# Patient Record
Sex: Male | Born: 1960
Health system: Southern US, Community
[De-identification: ages and names within clinical notes are randomized; demographics above are authoritative.]

## PROBLEM LIST (undated history)

## (undated) DIAGNOSIS — J449 Chronic obstructive pulmonary disease, unspecified: Secondary | ICD-10-CM

## (undated) DIAGNOSIS — M1990 Unspecified osteoarthritis, unspecified site: Secondary | ICD-10-CM

## (undated) DIAGNOSIS — M4800 Spinal stenosis, site unspecified: Secondary | ICD-10-CM

## (undated) DIAGNOSIS — G4730 Sleep apnea, unspecified: Secondary | ICD-10-CM

## (undated) DIAGNOSIS — E119 Type 2 diabetes mellitus without complications: Secondary | ICD-10-CM

## (undated) DIAGNOSIS — K759 Inflammatory liver disease, unspecified: Secondary | ICD-10-CM

## (undated) HISTORY — DX: Type 2 diabetes mellitus without complications: E11.9

## (undated) HISTORY — DX: Chronic obstructive pulmonary disease, unspecified: J44.9

## (undated) HISTORY — PX: TONSILLECTOMY: SUR1361

---

## 2014-10-13 ENCOUNTER — Encounter: Payer: Self-pay | Admitting: Neurology

## 2014-10-13 ENCOUNTER — Ambulatory Visit (INDEPENDENT_AMBULATORY_CARE_PROVIDER_SITE_OTHER): Payer: BC Managed Care – PPO | Admitting: Neurology

## 2014-10-13 VITALS — BP 133/80 | HR 84 | Ht 69.0 in | Wt 251.0 lb

## 2014-10-13 DIAGNOSIS — Z9989 Dependence on other enabling machines and devices: Principal | ICD-10-CM

## 2014-10-13 DIAGNOSIS — E785 Hyperlipidemia, unspecified: Secondary | ICD-10-CM

## 2014-10-13 DIAGNOSIS — E669 Obesity, unspecified: Secondary | ICD-10-CM

## 2014-10-13 DIAGNOSIS — G4733 Obstructive sleep apnea (adult) (pediatric): Secondary | ICD-10-CM

## 2014-10-13 MED ORDER — OMEGA-3-ACID ETHYL ESTERS 1 G PO CAPS: 1.0000 g | ORAL_CAPSULE | Freq: Two times a day (BID) | ORAL | Status: DC

## 2014-10-13 NOTE — Patient Instructions (Addendum)
Please continue using your CPAP regularly. While your insurance requires that you use CPAP at least 4 hours each night on 70% of the nights, I recommend, that you not skip any nights and use it throughout the night if you can. Getting used to CPAP and staying with the treatment long term does take time and patience and discipline. Untreated obstructive sleep apnea when it is moderate to severe can have an adverse impact on cardiovascular health and raise her risk for heart disease, arrhythmias, hypertension, congestive heart failure, stroke and diabetes. Untreated obstructive sleep apnea causes sleep disruption, nonrestorative sleep, and sleep deprivation. This can have an impact on your day to day functioning and cause daytime sleepiness and impairment of cognitive function, memory loss, mood disturbance, and problems focussing. Using CPAP regularly can improve these symptoms.   You can try Melatonin at night for sleep: take 3 to 5 mg one to 2 hours before your bedtime.   Please remember to try to maintain good sleep hygiene, which means: Keep a regular sleep and wake schedule, try not to exercise or have a meal within 2 hours of your bedtime, try to keep your bedroom conducive for sleep, that is, cool and dark, without light distractors such as an illuminated alarm clock, and refrain from watching TV right before sleep or in the middle of the night and do not keep the TV or radio on during the night. Also, try not to use or play on electronic devices at bedtime, such as your cell phone, tablet PC or laptop. If you like to read at bedtime on an electronic device, try to dim the background light as much as possible. Do not eat in the middle of the night.
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
## 2014-11-23 ENCOUNTER — Encounter: Payer: Self-pay | Admitting: Neurology

## 2014-12-27 ENCOUNTER — Other Ambulatory Visit: Payer: Self-pay | Admitting: Neurology

## 2014-12-28 NOTE — Telephone Encounter (Signed)
I called and spoke with patient.  They just got a PCP, and are agreeable to contacting them for refills.

## 2014-12-28 NOTE — Telephone Encounter (Signed)
Please see note. Patient was supposed to get prescription from primary care physician and I bridged prescription. He needs to get refill for all cholesterol and blood pressure medications from primary care physician. Please advise patient.

## 2014-12-28 NOTE — Telephone Encounter (Signed)
This was prescribed at [facility] in Nov.  Wasn't sure if you wanted to refill?  Thank you.

## 2015-04-12 ENCOUNTER — Other Ambulatory Visit: Payer: Self-pay | Admitting: Gastroenterology

## 2015-04-12 DIAGNOSIS — B1920 Unspecified viral hepatitis C without hepatic coma: Secondary | ICD-10-CM

## 2015-04-13 ENCOUNTER — Ambulatory Visit: Payer: BC Managed Care – PPO | Admitting: Neurology

## 2015-04-21 ENCOUNTER — Ambulatory Visit
Admission: RE | Admit: 2015-04-21 | Discharge: 2015-04-21 | Disposition: A | Discharge status: Home / Self Care | Payer: BLUE CROSS/BLUE SHIELD | Source: Ambulatory Visit | Attending: Gastroenterology | Admitting: Gastroenterology

## 2015-04-21 DIAGNOSIS — B1920 Unspecified viral hepatitis C without hepatic coma: Secondary | ICD-10-CM

## 2015-11-10 ENCOUNTER — Other Ambulatory Visit (HOSPITAL_COMMUNITY): Payer: Self-pay | Admitting: Specialist

## 2015-11-10 DIAGNOSIS — M5136 Other intervertebral disc degeneration, lumbar region: Secondary | ICD-10-CM

## 2015-11-17 ENCOUNTER — Other Ambulatory Visit (HOSPITAL_COMMUNITY): Payer: Self-pay | Admitting: Specialist

## 2015-11-17 ENCOUNTER — Ambulatory Visit (HOSPITAL_COMMUNITY)
Admission: RE | Admit: 2015-11-17 | Discharge: 2015-11-17 | Disposition: A | Discharge status: Home / Self Care | Payer: 59 | Source: Ambulatory Visit | Attending: Specialist | Admitting: Specialist

## 2015-11-17 DIAGNOSIS — M545 Low back pain: Secondary | ICD-10-CM | POA: Insufficient documentation

## 2015-11-17 DIAGNOSIS — Z01818 Encounter for other preprocedural examination: Secondary | ICD-10-CM | POA: Insufficient documentation

## 2015-11-17 DIAGNOSIS — M5136 Other intervertebral disc degeneration, lumbar region: Secondary | ICD-10-CM | POA: Insufficient documentation

## 2015-11-17 DIAGNOSIS — M4807 Spinal stenosis, lumbosacral region: Secondary | ICD-10-CM | POA: Diagnosis not present

## 2015-11-17 DIAGNOSIS — M4806 Spinal stenosis, lumbar region: Secondary | ICD-10-CM | POA: Insufficient documentation

## 2015-11-17 DIAGNOSIS — M51369 Other intervertebral disc degeneration, lumbar region without mention of lumbar back pain or lower extremity pain: Secondary | ICD-10-CM

## 2015-11-30 ENCOUNTER — Ambulatory Visit: Payer: Self-pay | Admitting: Orthopedic Surgery

## 2015-11-30 NOTE — H&P (Signed)
Andres DelGeorge E Dauenhauer Jr. is an 54 y.o. male.   Chief Complaint: back and R leg pain HPI: The patient is a 54 year old male who presents today for follow up of their back. The patient is being followed for their right-sided. They are now 13 day(s) out from right L4 selective nerve root block, right L4-5 facet injection. Symptoms reported today include: pain (by the end of the day he feels it in the leg), aching and throbbing. The patient states that they are doing 40 percent better (better than before, tolerable first thing in the morning but pain worsens as the day goes on). Current treatment includes: activity modification. The following medication has been used for pain control: none. The patient reports their current pain level to be 7 / 10. The patient presents today following ESI. The patient indicates that they have questions or concerns today regarding Medications (patient is requesting refill for Hydrocodone). Note for "Follow-up back": patient is out of hydrocodone and needs a refill  He follows up from his epidural. Overall, he seems better. He still reports weakness into the leg. 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 He has had a selective nerve root block and a facet injection.  Past Medical History  Diagnosis Date  . COPD (chronic obstructive pulmonary disease)   . Diabetes mellitus without complication     No past surgical history on file.  Family History  Problem Relation Age of Onset  . Hypertension Father   . High Cholesterol Father   . Arthritis Father    Social History:  reports that he has quit smoking. His smokeless tobacco use includes Snuff. He reports that he drinks alcohol. He reports that he does not use illicit drugs.  Allergies:  Allergies  Allergen Reactions  . Penicillins Hives     (Not in a hospital admission)  No results found for this or any previous visit (from the past 48 hour(s)). No results found.  Review of Systems  Constitutional:  Negative.   HENT: Negative.   Eyes: Negative.   Respiratory: Negative.   Cardiovascular: Negative.   Gastrointestinal: Negative.   Genitourinary: Negative.   Musculoskeletal: Positive for back pain.  Skin: Negative.   Neurological: Positive for sensory change and focal weakness.    There were no vitals taken for this visit. Physical Exam  Constitutional: He is oriented to person, place, and time. He appears well-developed and well-nourished.  HENT:  Head: Normocephalic.  Eyes: Pupils are equal, round, and reactive to light.  Neck: Normal range of motion.  Cardiovascular: Normal rate.   Respiratory: Effort normal.  GI: Soft.  Musculoskeletal:  He has some slight quad weakness on the right. Slightly diminished knee reflex. Pain with extension and flexion of the lumbar spine, both limited.  Lumbar spine exam reveals no evidence of soft tissue swelling, deformity or skin ecchymosis. On palpation there is no tenderness of the lumbar spine. No flank pain with percussion. The abdomen is soft and nontender. Nontender over the trochanters. No cellulitis or lymphadenopathy.  Straight leg raise is negative. Motor is 5/5 including EHL, tibialis anterior, plantar flexion, and hamstrings. Patient is normoreflexic. There is no Babinski or clonus. Sensory exam is intact to light touch. Patient has good distal pulses. No DVT. No pain and normal range of motion without instability of the hips, knees and ankles.  Neurological: He is alert and oriented to person, place, and time.  Skin: Skin is warm and dry.  Psychiatric: He  has a normal mood and affect.     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  We had extensive discussion concerning his current pathology, relevant anatomy and treatment options. He reports symptoms since 2005 into the leg more over the past year. He is still taking Norco 10/325. We discussed options at  this point in time for ongoing symptoms, living with his symptoms versus decompression. At this point with ongoing symptoms and neurologic deficit, we will update his MRI, determine the size of the cyst and the neural compression. Certainly if it is a larger or unchanged, then may consider lumbar decompression. If smaller and resolving, then continued observation. We will have him set up for that procedure and see him back following that.  This gentleman is a 54 year old with right lower extremity radicular pain, refractory, secondary to synovial cyst at L4-L5 to the right with compression of the L4 root in the foramen. He has had epidurals and synovial cysts injections without avail. Overall, he continues to be miserable. We repeated his [facility] at [facility]. He has severe stenosis along the course of the descending L4 root related to, most likely a proteinaceous synovial cyst from the right facet. There is just a very small disk protrusion at L4-L5. He has a grade I listhesis at L3-L4, trace listhesis at L4-L5. He has predominant leg pain disposed to back pain. He is a smoker. He has asked about options in terms of diminishing his pain. He is working in [place], unable to get here to [place], unless on the weekends, so we are doing this by telephone conversation. Again, it radiates down into the top of his foot. He does have some quadriceps knee and toe weakness. We talked about getting an appointment back in the office, which he will try to do. We would like to go ahead and proceed with scheduling of any intervention that would be beneficial. We discussed a decompression at L4-L5, possibly L3-L4 excision of synovial cyst. I had an extensive discussion of the risks and benefits of the lumbar decompression with the patient, including bleeding, infection, damage to neurovascular structures, epidural fibrosis, CSF leak requiring repair. We also discussed increase in pain, adjacent segment disease, recurrent disk  herniation, need for future surgery, including repeat decompression and/or fusion. We also discussed risks of postoperative hematoma, paralysis, anesthetic complications including DVT, PE, death, cardiopulmonary dysfunction. In addition, the perioperative and postoperative courses were discussed in detail including the rehabilitative time and return to functional activity and work. I provided the patient with an illustrated handout and utilized the appropriate surgical models. He is a smoker. I advised him against that. It can have deleterious side effects on that. He does have some non-insulin dependant diabetes, a smoker, it would be prudent to have a preoperative clearance prior to that. His medical physician is Dr. Alysia Penna.  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 He has trace listhesis at L3-L4 and then there is not an empty facet sign. His option is decompress the cyst on the right and excise the synovial lining without violating a significant portion of the facet. We did indicate that the cyst can return and that it may require repeat excision and/or fusion. We discussed with any ongoing back pain, the only procedure would be a surgical fusion. He is not interested  in this at this point in time and it would require instrumentation at two levels since he has got a listhesis at L3-L4, any instrumentation of L4-L5 would load the L3-L4 facet, so again it would be more prudent to perform this as minimally invasive without a fusion. Again though, my concern is that he has the facet arthrosis and the ongoing pathology that may cause recurrence of the cyst; he understands. Just to decompress it, we specifically talked about the possibility of CSF leakage with effacement and the neural compression. It may require on the right perhaps a whole hemilaminectomy to fully  evaluate the L4 root out into the foramen. He does have a small disk herniation there that may required discectomy. Hopefully that will not be necessary from the standpoint of his disk's stability. I spent considerable time discussing these. I sent the T2 images to him for consultation, but we will proceed accordingly. We discussed the peri-operative course in detail and the course to return to work. If he has any change in the interim, he is to call.  Plan microlumbar decompression L4-5 right, possible L3-4, removal of synovial cyst  BISSELL, JACLYN M. PA-C for Dr. Shelle Iron 11/30/2015, 4:55 PM

## 2015-12-20 ENCOUNTER — Encounter (HOSPITAL_COMMUNITY): Payer: Self-pay

## 2015-12-20 ENCOUNTER — Ambulatory Visit (HOSPITAL_COMMUNITY)
Admission: RE | Admit: 2015-12-20 | Discharge: 2015-12-20 | Disposition: A | Discharge status: Home / Self Care | Payer: BLUE CROSS/BLUE SHIELD | Source: Ambulatory Visit | Attending: Orthopedic Surgery | Admitting: Orthopedic Surgery

## 2015-12-20 ENCOUNTER — Encounter (HOSPITAL_COMMUNITY)
Admission: RE | Admit: 2015-12-20 | Discharge: 2015-12-20 | Disposition: A | Discharge status: Home / Self Care | Payer: BLUE CROSS/BLUE SHIELD | Source: Ambulatory Visit | Attending: Specialist | Admitting: Specialist

## 2015-12-20 ENCOUNTER — Encounter (INDEPENDENT_AMBULATORY_CARE_PROVIDER_SITE_OTHER): Payer: Self-pay

## 2015-12-20 DIAGNOSIS — J449 Chronic obstructive pulmonary disease, unspecified: Secondary | ICD-10-CM | POA: Insufficient documentation

## 2015-12-20 DIAGNOSIS — I10 Essential (primary) hypertension: Secondary | ICD-10-CM | POA: Insufficient documentation

## 2015-12-20 DIAGNOSIS — M48061 Spinal stenosis, lumbar region without neurogenic claudication: Secondary | ICD-10-CM

## 2015-12-20 DIAGNOSIS — M5136 Other intervertebral disc degeneration, lumbar region: Secondary | ICD-10-CM | POA: Insufficient documentation

## 2015-12-20 DIAGNOSIS — Z87891 Personal history of nicotine dependence: Secondary | ICD-10-CM | POA: Diagnosis not present

## 2015-12-20 DIAGNOSIS — M545 Low back pain: Secondary | ICD-10-CM | POA: Diagnosis not present

## 2015-12-20 DIAGNOSIS — E119 Type 2 diabetes mellitus without complications: Secondary | ICD-10-CM | POA: Diagnosis not present

## 2015-12-20 DIAGNOSIS — M4806 Spinal stenosis, lumbar region: Secondary | ICD-10-CM | POA: Diagnosis not present

## 2015-12-20 HISTORY — DX: Inflammatory liver disease, unspecified: K75.9

## 2015-12-20 HISTORY — DX: Spinal stenosis, site unspecified: M48.00

## 2015-12-20 HISTORY — DX: Unspecified osteoarthritis, unspecified site: M19.90

## 2015-12-20 HISTORY — DX: Sleep apnea, unspecified: G47.30

## 2015-12-20 LAB — BASIC METABOLIC PANEL
ANION GAP: 12 (ref 5–15)
BUN: 9 mg/dL (ref 6–20)
CO2: 25 mmol/L (ref 22–32)
Calcium: 9.2 mg/dL (ref 8.9–10.3)
Chloride: 104 mmol/L (ref 101–111)
Creatinine, Ser: 0.6 mg/dL — ABNORMAL LOW (ref 0.61–1.24)
GFR calc Af Amer: >60 mL/min (ref >60)
GLUCOSE: 107 mg/dL — AB (ref 65–99)
POTASSIUM: 4.2 mmol/L (ref 3.5–5.1)
Sodium: 141 mmol/L (ref 135–145)

## 2015-12-20 LAB — CBC
HEMATOCRIT: 45.5 % (ref 39.0–52.0)
HEMOGLOBIN: 15.6 g/dL (ref 13.0–17.0)
MCH: 31.3 pg (ref 26.0–34.0)
MCHC: 34.3 g/dL (ref 30.0–36.0)
MCV: 91.4 fL (ref 78.0–100.0)
Platelets: 267 10*3/uL (ref 150–400)
RBC: 4.98 MIL/uL (ref 4.22–5.81)
RDW: 13.1 % (ref 11.5–15.5)
WBC: 12.7 10*3/uL — ABNORMAL HIGH (ref 4.0–10.5)

## 2015-12-20 LAB — SURGICAL PCR SCREEN
MRSA, PCR: NEGATIVE
STAPHYLOCOCCUS AUREUS: NEGATIVE

## 2015-12-20 NOTE — Patient Instructions (Addendum)
YOUR PROCEDURE IS SCHEDULED ON :  12/21/15  REPORT TO [facility] MAIN ENTRANCE FOLLOW SIGNS TO EAST ELEVATOR - GO TO 3rd FLOOR CHECK IN AT 3 EAST NURSES STATION (SHORT STAY) AT:  8:30 AM  CALL THIS NUMBER IF YOU HAVE PROBLEMS THE MORNING OF SURGERY [phone]  REMEMBER:ONLY 1 PER PERSON MAY GO TO SHORT STAY WITH YOU TO GET READY THE MORNING OF YOUR SURGERY  DO NOT EAT FOOD OR DRINK LIQUIDS AFTER MIDNIGHT  TAKE THESE MEDICINES THE MORNING OF SURGERY: NONE  BRING C-PAP TUBING AND MASK TO HOSPITAL  YOU MAY NOT HAVE ANY METAL ON YOUR BODY INCLUDING HAIR PINS AND PIERCING'S. DO NOT WEAR JEWELRY, MAKEUP, LOTIONS, POWDERS OR PERFUMES. DO NOT WEAR NAIL POLISH. DO NOT SHAVE 48 HRS PRIOR TO SURGERY. MEN MAY SHAVE FACE AND NECK.  DO NOT BRING VALUABLES TO HOSPITAL. [facility] IS NOT RESPONSIBLE FOR VALUABLES.  CONTACTS, DENTURES OR PARTIALS MAY NOT BE WORN TO SURGERY. LEAVE SUITCASE IN CAR. CAN BE BROUGHT TO ROOM AFTER SURGERY.  PATIENTS DISCHARGED THE DAY OF SURGERY WILL NOT BE ALLOWED TO DRIVE HOME.  PLEASE READ OVER THE FOLLOWING INSTRUCTION SHEETS _________________________________________________________________________________                                          [place] - PREPARING FOR SURGERY  Before surgery, you can play an important role.  Because skin is not sterile, your skin needs to be as free of germs as possible.  You can reduce the number of germs on your skin by washing with CHG (chlorahexidine gluconate) soap before surgery.  CHG is an antiseptic cleaner which kills germs and bonds with the skin to continue killing germs even after washing. Please DO NOT use if you have an allergy to CHG or antibacterial soaps.  If your skin becomes reddened/irritated stop using the CHG and inform your nurse when you arrive at Short Stay. Do not shave (including legs and underarms) for at least 48 hours prior to the first CHG shower.  You may shave your  face. Please follow these instructions carefully:   1.  Shower with CHG Soap the night before surgery and the  morning of Surgery.   2.  If you choose to wash your hair, wash your hair first as usual with your  normal  Shampoo.   3.  After you shampoo, rinse your hair and body thoroughly to remove the  shampoo.                                         4.  Use CHG as you would any other liquid soap.  You can apply chg directly  to the skin and wash . Gently wash with scrungie or clean wascloth    5.  Apply the CHG Soap to your body ONLY FROM THE NECK DOWN.   Do not use on open                           Wound or open sores. Avoid contact with eyes, ears mouth and genitals (private parts).                        Genitals (  private parts) with your normal soap.              6.  Wash thoroughly, paying special attention to the area where your surgery  will be performed.   7.  Thoroughly rinse your body with warm water from the neck down.   8.  DO NOT shower/wash with your normal soap after using and rinsing off  the CHG Soap .                9.  Pat yourself dry with a clean towel.             10.  Wear clean night clothes to bed after shower             11.  Place clean sheets on your bed the night of your first shower and do not  sleep with pets.  Day of Surgery : Do not apply any lotions/deodorants the morning of surgery.  Please wear clean clothes to the hospital/surgery center.  FAILURE TO FOLLOW THESE INSTRUCTIONS MAY RESULT IN THE CANCELLATION OF YOUR SURGERY    PATIENT SIGNATURE_________________________________  ______________________________________________________________________     Andres Rodriguez  An incentive spirometer is a tool that can help keep your lungs clear and active. This tool measures how well you are filling your lungs with each breath. Taking long deep breaths may help reverse or decrease the chance of developing breathing (pulmonary) problems  (especially infection) following:  A long period of time when you are unable to move or be active. BEFORE THE PROCEDURE   If the spirometer includes an indicator to show your best effort, your nurse or respiratory therapist will set it to a desired goal.  If possible, sit up straight or lean slightly forward. Try not to slouch.  Hold the incentive spirometer in an upright position. INSTRUCTIONS FOR USE  1. Sit on the edge of your bed if possible, or sit up as far as you can in bed or on a chair. 2. Hold the incentive spirometer in an upright position. 3. Breathe out normally. 4. Place the mouthpiece in your mouth and seal your lips tightly around it. 5. Breathe in slowly and as deeply as possible, raising the piston or the ball toward the top of the column. 6. Hold your breath for 3-5 seconds or for as long as possible. Allow the piston or ball to fall to the bottom of the column. 7. Remove the mouthpiece from your mouth and breathe out normally. 8. Rest for a few seconds and repeat Steps 1 through 7 at least 10 times every 1-2 hours when you are awake. Take your time and take a few normal breaths between deep breaths. 9. The spirometer may include an indicator to show your best effort. Use the indicator as a goal to work toward during each repetition. 10. After each set of 10 deep breaths, practice coughing to be sure your lungs are clear. If you have an incision (the cut made at the time of surgery), support your incision when coughing by placing a pillow or rolled up towels firmly against it. Once you are able to get out of bed, walk around indoors and cough well. You may stop using the incentive spirometer when instructed by your caregiver.  RISKS AND COMPLICATIONS  Take your time so you do not get dizzy or light-headed.  If you are in pain, you may need to take or ask for pain medication before doing incentive spirometry. It is harder  to take a deep breath if you are having  pain. AFTER USE  Rest and breathe slowly and easily.  It can be helpful to keep track of a log of your progress. Your caregiver can provide you with a simple table to help with this. If you are using the spirometer at home, follow these instructions: [place] IF:   You are having difficultly using the spirometer.  You have trouble using the spirometer as often as instructed.  Your pain medication is not giving enough relief while using the spirometer.  You develop fever of 100.5 F (38.1 C) or higher. SEEK IMMEDIATE MEDICAL CARE IF:   You cough up bloody sputum that had not been present before.  You develop fever of 102 F (38.9 C) or greater.  You develop worsening pain at or near the incision site. MAKE SURE YOU:   Understand these instructions.  Will watch your condition.  Will get help right away if you are not doing well or get worse. Document Released: 04/01/2007 Document Revised: 02/11/2012 Document Reviewed: 06/02/2007 [facility] Patient Information 2014 [place], [place].   ________________________________________________________________________

## 2015-12-21 ENCOUNTER — Ambulatory Visit (HOSPITAL_COMMUNITY): Payer: BLUE CROSS/BLUE SHIELD | Admitting: Anesthesiology

## 2015-12-21 ENCOUNTER — Ambulatory Visit (HOSPITAL_COMMUNITY): Payer: BLUE CROSS/BLUE SHIELD

## 2015-12-21 ENCOUNTER — Ambulatory Visit (HOSPITAL_COMMUNITY)
Admission: RE | Admit: 2015-12-21 | Discharge: 2015-12-22 | Disposition: A | Discharge status: Home / Self Care | Payer: BLUE CROSS/BLUE SHIELD | Source: Ambulatory Visit | Attending: Specialist | Admitting: Specialist

## 2015-12-21 ENCOUNTER — Encounter (HOSPITAL_COMMUNITY)
Admission: RE | Disposition: A | Discharge status: Home / Self Care | Payer: Self-pay | Source: Ambulatory Visit | Attending: Specialist

## 2015-12-21 ENCOUNTER — Encounter (HOSPITAL_COMMUNITY): Payer: Self-pay | Admitting: *Deleted

## 2015-12-21 DIAGNOSIS — Z87891 Personal history of nicotine dependence: Secondary | ICD-10-CM | POA: Diagnosis not present

## 2015-12-21 DIAGNOSIS — B1920 Unspecified viral hepatitis C without hepatic coma: Secondary | ICD-10-CM | POA: Diagnosis not present

## 2015-12-21 DIAGNOSIS — M7138 Other bursal cyst, other site: Secondary | ICD-10-CM

## 2015-12-21 DIAGNOSIS — Z9889 Other specified postprocedural states: Secondary | ICD-10-CM | POA: Diagnosis not present

## 2015-12-21 DIAGNOSIS — E119 Type 2 diabetes mellitus without complications: Secondary | ICD-10-CM | POA: Diagnosis not present

## 2015-12-21 DIAGNOSIS — Z7984 Long term (current) use of oral hypoglycemic drugs: Secondary | ICD-10-CM | POA: Insufficient documentation

## 2015-12-21 DIAGNOSIS — M79604 Pain in right leg: Secondary | ICD-10-CM | POA: Diagnosis present

## 2015-12-21 DIAGNOSIS — M4806 Spinal stenosis, lumbar region: Secondary | ICD-10-CM | POA: Diagnosis not present

## 2015-12-21 DIAGNOSIS — M5416 Radiculopathy, lumbar region: Secondary | ICD-10-CM | POA: Insufficient documentation

## 2015-12-21 DIAGNOSIS — J449 Chronic obstructive pulmonary disease, unspecified: Secondary | ICD-10-CM | POA: Diagnosis not present

## 2015-12-21 DIAGNOSIS — M479 Spondylosis, unspecified: Secondary | ICD-10-CM | POA: Diagnosis not present

## 2015-12-21 DIAGNOSIS — G4730 Sleep apnea, unspecified: Secondary | ICD-10-CM | POA: Insufficient documentation

## 2015-12-21 DIAGNOSIS — Z9989 Dependence on other enabling machines and devices: Secondary | ICD-10-CM | POA: Insufficient documentation

## 2015-12-21 DIAGNOSIS — Z419 Encounter for procedure for purposes other than remedying health state, unspecified: Secondary | ICD-10-CM

## 2015-12-21 DIAGNOSIS — I10 Essential (primary) hypertension: Secondary | ICD-10-CM | POA: Diagnosis not present

## 2015-12-21 DIAGNOSIS — M48061 Spinal stenosis, lumbar region without neurogenic claudication: Secondary | ICD-10-CM

## 2015-12-21 HISTORY — PX: CYST EXCISION: SHX5701

## 2015-12-21 HISTORY — PX: LUMBAR LAMINECTOMY/DECOMPRESSION MICRODISCECTOMY: SHX5026

## 2015-12-21 LAB — GLUCOSE, CAPILLARY
GLUCOSE-CAPILLARY: 133 mg/dL — AB (ref 65–99)
GLUCOSE-CAPILLARY: 142 mg/dL — AB (ref 65–99)
Glucose-Capillary: 133 mg/dL — ABNORMAL HIGH (ref 65–99)
Glucose-Capillary: 123 mg/dL — ABNORMAL HIGH (ref 65–99)

## 2015-12-21 LAB — HEMOGLOBIN A1C
HEMOGLOBIN A1C: 6.4 % — AB (ref 4.8–5.6)
Mean Plasma Glucose: 137 mg/dL

## 2015-12-21 SURGERY — LUMBAR LAMINECTOMY/DECOMPRESSION MICRODISCECTOMY 2 LEVELS
Anesthesia: General | Site: Back | Laterality: Right

## 2015-12-21 MED ORDER — HYDROMORPHONE HCL 1 MG/ML IJ SOLN
INTRAMUSCULAR | Status: AC
  Filled 2015-12-21: qty 1

## 2015-12-21 MED ORDER — PROPOFOL 10 MG/ML IV BOLUS
INTRAVENOUS | Status: AC
Start: 2015-12-21 — End: 2015-12-21
  Filled 2015-12-21: qty 20

## 2015-12-21 MED ORDER — NEOSTIGMINE METHYLSULFATE 10 MG/10ML IV SOLN
INTRAVENOUS | Status: AC
  Filled 2015-12-21: qty 1

## 2015-12-21 MED ORDER — SODIUM CHLORIDE 0.9 % IR SOLN
Status: DC | PRN
  Administered 2015-12-21: 500 mL

## 2015-12-21 MED ORDER — MIDAZOLAM HCL 2 MG/2ML IJ SOLN
INTRAMUSCULAR | Status: AC
  Filled 2015-12-21: qty 2

## 2015-12-21 MED ORDER — PROPOFOL 10 MG/ML IV BOLUS
INTRAVENOUS | Status: DC | PRN
  Administered 2015-12-21: 200 mg via INTRAVENOUS

## 2015-12-21 MED ORDER — BUPIVACAINE-EPINEPHRINE (PF) 0.5% -1:200000 IJ SOLN
INTRAMUSCULAR | Status: AC
  Filled 2015-12-21: qty 30

## 2015-12-21 MED ORDER — OXYCODONE HCL 5 MG/5ML PO SOLN
5.0000 mg | Freq: Once | ORAL | Status: DC | PRN
Start: 2015-12-21 — End: 2015-12-21
  Filled 2015-12-21: qty 5

## 2015-12-21 MED ORDER — CLINDAMYCIN PHOSPHATE 900 MG/50ML IV SOLN
900.0000 mg | Freq: Three times a day (TID) | INTRAVENOUS | Status: DC
  Administered 2015-12-21 – 2015-12-22 (×2): 900 mg via INTRAVENOUS
  Filled 2015-12-21 (×3): qty 50

## 2015-12-21 MED ORDER — CETYLPYRIDINIUM CHLORIDE 0.05 % MT LIQD: 7.0000 mL | Freq: Two times a day (BID) | OROMUCOSAL | Status: DC

## 2015-12-21 MED ORDER — HYDROMORPHONE HCL 1 MG/ML IJ SOLN
0.2500 mg | INTRAMUSCULAR | Status: DC | PRN
  Administered 2015-12-21 (×3): 0.5 mg via INTRAVENOUS

## 2015-12-21 MED ORDER — MENTHOL 3 MG MT LOZG
1.0000 | LOZENGE | OROMUCOSAL | Status: DC | PRN
  Filled 2015-12-21 (×2): qty 9

## 2015-12-21 MED ORDER — OXYCODONE HCL 5 MG PO TABS: 5.0000 mg | ORAL_TABLET | Freq: Once | ORAL | Status: DC | PRN

## 2015-12-21 MED ORDER — METHOCARBAMOL 500 MG PO TABS: 500.0000 mg | ORAL_TABLET | Freq: Three times a day (TID) | ORAL | Status: AC | PRN

## 2015-12-21 MED ORDER — ACETAMINOPHEN 650 MG RE SUPP: 650.0000 mg | RECTAL | Status: DC | PRN

## 2015-12-21 MED ORDER — INSULIN ASPART 100 UNIT/ML ~~LOC~~ SOLN: 0.0000 [IU] | Freq: Three times a day (TID) | SUBCUTANEOUS | Status: DC

## 2015-12-21 MED ORDER — CLINDAMYCIN PHOSPHATE 900 MG/50ML IV SOLN
900.0000 mg | INTRAVENOUS | Status: AC
  Administered 2015-12-21: 900 mg via INTRAVENOUS

## 2015-12-21 MED ORDER — MIDAZOLAM HCL 5 MG/5ML IJ SOLN
INTRAMUSCULAR | Status: DC | PRN
  Administered 2015-12-21: 2 mg via INTRAVENOUS

## 2015-12-21 MED ORDER — CHLORHEXIDINE GLUCONATE 0.12 % MT SOLN
15.0000 mL | Freq: Two times a day (BID) | OROMUCOSAL | Status: DC
  Administered 2015-12-22: 15 mL via OROMUCOSAL
  Filled 2015-12-21 (×2): qty 15

## 2015-12-21 MED ORDER — RISAQUAD PO CAPS
1.0000 | ORAL_CAPSULE | Freq: Every day | ORAL | Status: DC
  Administered 2015-12-21 – 2015-12-22 (×2): 1 via ORAL
  Filled 2015-12-21 (×2): qty 1

## 2015-12-21 MED ORDER — HYDROCODONE-ACETAMINOPHEN 5-325 MG PO TABS
1.0000 | ORAL_TABLET | ORAL | Status: DC | PRN
Start: 2015-12-21 — End: 2015-12-22

## 2015-12-21 MED ORDER — PHENOL 1.4 % MT LIQD: 1.0000 | OROMUCOSAL | Status: DC | PRN

## 2015-12-21 MED ORDER — DOCUSATE SODIUM 100 MG PO CAPS: 100.0000 mg | ORAL_CAPSULE | Freq: Two times a day (BID) | ORAL | Status: AC | PRN

## 2015-12-21 MED ORDER — EPHEDRINE SULFATE 50 MG/ML IJ SOLN
INTRAMUSCULAR | Status: DC | PRN
  Administered 2015-12-21 (×4): 10 mg via INTRAVENOUS

## 2015-12-21 MED ORDER — LIDOCAINE HCL (CARDIAC) 20 MG/ML IV SOLN
INTRAVENOUS | Status: AC
  Filled 2015-12-21: qty 5

## 2015-12-21 MED ORDER — THROMBIN 5000 UNITS EX SOLR
CUTANEOUS | Status: DC | PRN
  Administered 2015-12-21: 10000 [IU] via TOPICAL

## 2015-12-21 MED ORDER — DOCUSATE SODIUM 100 MG PO CAPS
100.0000 mg | ORAL_CAPSULE | Freq: Two times a day (BID) | ORAL | Status: DC
  Administered 2015-12-21 – 2015-12-22 (×2): 100 mg via ORAL

## 2015-12-21 MED ORDER — LOSARTAN POTASSIUM 25 MG PO TABS
25.0000 mg | ORAL_TABLET | Freq: Every day | ORAL | Status: DC
  Filled 2015-12-21 (×2): qty 1

## 2015-12-21 MED ORDER — ROCURONIUM BROMIDE 100 MG/10ML IV SOLN
INTRAVENOUS | Status: DC | PRN
  Administered 2015-12-21: 50 mg via INTRAVENOUS
  Administered 2015-12-21 (×2): 10 mg via INTRAVENOUS

## 2015-12-21 MED ORDER — THROMBIN 5000 UNITS EX SOLR
CUTANEOUS | Status: AC
  Filled 2015-12-21: qty 10000

## 2015-12-21 MED ORDER — FENTANYL CITRATE (PF) 100 MCG/2ML IJ SOLN
INTRAMUSCULAR | Status: DC | PRN
  Administered 2015-12-21: 50 ug via INTRAVENOUS
  Administered 2015-12-21 (×2): 100 ug via INTRAVENOUS

## 2015-12-21 MED ORDER — POTASSIUM CHLORIDE IN NACL 20-0.45 MEQ/L-% IV SOLN
INTRAVENOUS | Status: DC
  Administered 2015-12-21: 17:00:00 via INTRAVENOUS
  Filled 2015-12-21 (×2): qty 1000

## 2015-12-21 MED ORDER — BISACODYL 5 MG PO TBEC: 5.0000 mg | DELAYED_RELEASE_TABLET | Freq: Every day | ORAL | Status: DC | PRN

## 2015-12-21 MED ORDER — SENNOSIDES-DOCUSATE SODIUM 8.6-50 MG PO TABS: 1.0000 | ORAL_TABLET | Freq: Every evening | ORAL | Status: DC | PRN

## 2015-12-21 MED ORDER — ONDANSETRON HCL 4 MG/2ML IJ SOLN
INTRAMUSCULAR | Status: AC
  Filled 2015-12-21: qty 2

## 2015-12-21 MED ORDER — LACTATED RINGERS IV SOLN: INTRAVENOUS | Status: DC

## 2015-12-21 MED ORDER — ONDANSETRON HCL 4 MG/2ML IJ SOLN: 4.0000 mg | INTRAMUSCULAR | Status: DC | PRN

## 2015-12-21 MED ORDER — LIDOCAINE HCL (CARDIAC) 20 MG/ML IV SOLN
INTRAVENOUS | Status: DC | PRN
  Administered 2015-12-21: 50 mg via INTRAVENOUS

## 2015-12-21 MED ORDER — PROMETHAZINE HCL 25 MG/ML IJ SOLN: 6.2500 mg | INTRAMUSCULAR | Status: DC | PRN

## 2015-12-21 MED ORDER — OXYCODONE-ACETAMINOPHEN 5-325 MG PO TABS
1.0000 | ORAL_TABLET | ORAL | Status: DC | PRN
  Administered 2015-12-21 – 2015-12-22 (×3): 2 via ORAL
  Filled 2015-12-21 (×3): qty 2

## 2015-12-21 MED ORDER — LACTATED RINGERS IV SOLN
INTRAVENOUS | Status: DC
  Administered 2015-12-21: 1000 mL via INTRAVENOUS
  Administered 2015-12-21: 11:00:00 via INTRAVENOUS
  Administered 2015-12-21: 1000 mL via INTRAVENOUS

## 2015-12-21 MED ORDER — ACETAMINOPHEN 325 MG PO TABS: 650.0000 mg | ORAL_TABLET | ORAL | Status: DC | PRN

## 2015-12-21 MED ORDER — METHOCARBAMOL 500 MG PO TABS
500.0000 mg | ORAL_TABLET | Freq: Four times a day (QID) | ORAL | Status: DC | PRN
  Administered 2015-12-21: 500 mg via ORAL
  Filled 2015-12-21: qty 1

## 2015-12-21 MED ORDER — GLYCOPYRROLATE 0.2 MG/ML IJ SOLN
INTRAMUSCULAR | Status: DC | PRN
  Administered 2015-12-21: .8 mg via INTRAVENOUS

## 2015-12-21 MED ORDER — NEOSTIGMINE METHYLSULFATE 10 MG/10ML IV SOLN
INTRAVENOUS | Status: DC | PRN
  Administered 2015-12-21: 5 mg via INTRAVENOUS

## 2015-12-21 MED ORDER — HYDROMORPHONE HCL 1 MG/ML IJ SOLN
0.5000 mg | INTRAMUSCULAR | Status: DC | PRN
  Administered 2015-12-21: 1 mg via INTRAVENOUS
  Filled 2015-12-21: qty 1

## 2015-12-21 MED ORDER — SODIUM CHLORIDE 0.9 % IR SOLN
Status: AC
  Filled 2015-12-21: qty 1

## 2015-12-21 MED ORDER — CLINDAMYCIN PHOSPHATE 900 MG/50ML IV SOLN
INTRAVENOUS | Status: AC
  Filled 2015-12-21: qty 50

## 2015-12-21 MED ORDER — OXYCODONE-ACETAMINOPHEN 5-325 MG PO TABS: 1.0000 | ORAL_TABLET | ORAL | Status: AC | PRN

## 2015-12-21 MED ORDER — GLYCOPYRROLATE 0.2 MG/ML IJ SOLN
INTRAMUSCULAR | Status: AC
  Filled 2015-12-21: qty 4

## 2015-12-21 MED ORDER — ALUM & MAG HYDROXIDE-SIMETH 200-200-20 MG/5ML PO SUSP: 30.0000 mL | Freq: Four times a day (QID) | ORAL | Status: DC | PRN

## 2015-12-21 MED ORDER — METHOCARBAMOL 1000 MG/10ML IJ SOLN
500.0000 mg | Freq: Four times a day (QID) | INTRAVENOUS | Status: DC | PRN
  Administered 2015-12-21: 500 mg via INTRAVENOUS
  Filled 2015-12-21 (×2): qty 5

## 2015-12-21 MED ORDER — BUPIVACAINE-EPINEPHRINE (PF) 0.5% -1:200000 IJ SOLN
INTRAMUSCULAR | Status: DC | PRN
  Administered 2015-12-21: 10 mL

## 2015-12-21 MED ORDER — FENTANYL CITRATE (PF) 250 MCG/5ML IJ SOLN
INTRAMUSCULAR | Status: AC
  Filled 2015-12-21: qty 5

## 2015-12-21 SURGICAL SUPPLY — 48 items
BAG ZIPLOCK 12X15 (MISCELLANEOUS) ×3 IMPLANT
CLEANER TIP ELECTROSURG 2X2 (MISCELLANEOUS) ×3 IMPLANT
CLOSURE WOUND 1/2 X4 (GAUZE/BANDAGES/DRESSINGS) ×1
CLOTH 2% CHLOROHEXIDINE 3PK (PERSONAL CARE ITEMS) ×3 IMPLANT
DRAPE MICROSCOPE LEICA (MISCELLANEOUS) ×3 IMPLANT
DRAPE SHEET LG 3/4 BI-LAMINATE (DRAPES) IMPLANT
DRAPE SURG 17X11 SM STRL (DRAPES) ×3 IMPLANT
DRAPE UTILITY XL STRL (DRAPES) ×3 IMPLANT
DRSG AQUACEL AG ADV 3.5X 4 (GAUZE/BANDAGES/DRESSINGS) IMPLANT
DRSG AQUACEL AG ADV 3.5X 6 (GAUZE/BANDAGES/DRESSINGS) ×3 IMPLANT
DURAPREP 26ML APPLICATOR (WOUND CARE) ×3 IMPLANT
DURASEAL SPINE SEALANT 3ML (MISCELLANEOUS) IMPLANT
ELECT BLADE TIP CTD 4 INCH (ELECTRODE) ×3 IMPLANT
ELECT REM PT RETURN 9FT ADLT (ELECTROSURGICAL) ×3
ELECTRODE REM PT RTRN 9FT ADLT (ELECTROSURGICAL) ×1 IMPLANT
GLOVE BIOGEL PI IND STRL 7.0 (GLOVE) ×1 IMPLANT
GLOVE BIOGEL PI INDICATOR 7.0 (GLOVE) ×2
GLOVE SURG SS PI 7.0 STRL IVOR (GLOVE) ×3 IMPLANT
GLOVE SURG SS PI 7.5 STRL IVOR (GLOVE) IMPLANT
GLOVE SURG SS PI 8.0 STRL IVOR (GLOVE) ×6 IMPLANT
GOWN STRL REUS W/TWL XL LVL3 (GOWN DISPOSABLE) ×6 IMPLANT
IV CATH 14GX2 1/4 (CATHETERS) ×3 IMPLANT
KIT BASIN OR (CUSTOM PROCEDURE TRAY) ×3 IMPLANT
KIT POSITIONING SURG ANDREWS (MISCELLANEOUS) ×3 IMPLANT
MANIFOLD NEPTUNE II (INSTRUMENTS) ×3 IMPLANT
MARKER SKIN DUAL TIP RULER LAB (MISCELLANEOUS) ×3 IMPLANT
NEEDLE SPNL 18GX3.5 QUINCKE PK (NEEDLE) ×9 IMPLANT
PACK LAMINECTOMY ORTHO (CUSTOM PROCEDURE TRAY) ×3 IMPLANT
PATTIES SURGICAL .5 X.5 (GAUZE/BANDAGES/DRESSINGS) ×3 IMPLANT
PATTIES SURGICAL .75X.75 (GAUZE/BANDAGES/DRESSINGS) ×3 IMPLANT
PATTIES SURGICAL 1X1 (DISPOSABLE) ×3 IMPLANT
RUBBERBAND STERILE (MISCELLANEOUS) ×3 IMPLANT
SPONGE LAP 4X18 X RAY DECT (DISPOSABLE) IMPLANT
SPONGE SURGIFOAM ABS GEL 100 (HEMOSTASIS) ×3 IMPLANT
STAPLER VISISTAT (STAPLE) IMPLANT
STRIP CLOSURE SKIN 1/2X4 (GAUZE/BANDAGES/DRESSINGS) ×2 IMPLANT
SUT NURALON 4 0 TR CR/8 (SUTURE) IMPLANT
SUT PROLENE 3 0 PS 2 (SUTURE) ×3 IMPLANT
SUT VIC AB 1 CT1 27 (SUTURE) ×2
SUT VIC AB 1 CT1 27XBRD ANTBC (SUTURE) ×1 IMPLANT
SUT VIC AB 1-0 CT2 27 (SUTURE) ×3 IMPLANT
SUT VIC AB 2-0 CT1 27 (SUTURE)
SUT VIC AB 2-0 CT1 TAPERPNT 27 (SUTURE) IMPLANT
SUT VIC AB 2-0 CT2 27 (SUTURE) ×3 IMPLANT
SYR 3ML LL SCALE MARK (SYRINGE) ×3 IMPLANT
TOWEL OR 17X26 10 PK STRL BLUE (TOWEL DISPOSABLE) ×3 IMPLANT
TOWEL OR NON WOVEN STRL DISP B (DISPOSABLE) ×3 IMPLANT
YANKAUER SUCT BULB TIP NO VENT (SUCTIONS) ×3 IMPLANT

## 2015-12-21 NOTE — Progress Notes (Signed)
Pt will be in 1604 in 20 minutes.

## 2015-12-21 NOTE — H&P (View-Only) (Signed)
Andres DelGeorge E Dauenhauer Jr. is an 55 y.o. male.   Chief Complaint: back and R leg pain HPI: The patient is a 55 year old male who presents today for follow up of their back. The patient is being followed for their right-sided. They are now 13 day(s) out from right L4 selective nerve root block, right L4-5 facet injection. Symptoms reported today include: pain (by the end of the day he feels it in the leg), aching and throbbing. The patient states that they are doing 40 percent better (better than before, tolerable first thing in the morning but pain worsens as the day goes on). Current treatment includes: activity modification. The following medication has been used for pain control: none. The patient reports their current pain level to be 7 / 10. The patient presents today following ESI. The patient indicates that they have questions or concerns today regarding Medications (patient is requesting refill for Hydrocodone). Note for "Follow-up back": patient is out of hydrocodone and needs a refill  He follows up from his epidural. Overall, he seems better. He still reports weakness into the leg. 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 He has had a selective nerve root block and a facet injection.  Past Medical History  Diagnosis Date  . COPD (chronic obstructive pulmonary disease)   . Diabetes mellitus without complication     No past surgical history on file.  Family History  Problem Relation Age of Onset  . Hypertension Father   . High Cholesterol Father   . Arthritis Father    Social History:  reports that he has quit smoking. His smokeless tobacco use includes Snuff. He reports that he drinks alcohol. He reports that he does not use illicit drugs.  Allergies:  Allergies  Allergen Reactions  . Penicillins Hives     (Not in a hospital admission)  No results found for this or any previous visit (from the past 48 hour(s)). No results found.  Review of Systems  Constitutional:  Negative.   HENT: Negative.   Eyes: Negative.   Respiratory: Negative.   Cardiovascular: Negative.   Gastrointestinal: Negative.   Genitourinary: Negative.   Musculoskeletal: Positive for back pain.  Skin: Negative.   Neurological: Positive for sensory change and focal weakness.    There were no vitals taken for this visit. Physical Exam  Constitutional: He is oriented to person, place, and time. He appears well-developed and well-nourished.  HENT:  Head: Normocephalic.  Eyes: Pupils are equal, round, and reactive to light.  Neck: Normal range of motion.  Cardiovascular: Normal rate.   Respiratory: Effort normal.  GI: Soft.  Musculoskeletal:  He has some slight quad weakness on the right. Slightly diminished knee reflex. Pain with extension and flexion of the lumbar spine, both limited.  Lumbar spine exam reveals no evidence of soft tissue swelling, deformity or skin ecchymosis. On palpation there is no tenderness of the lumbar spine. No flank pain with percussion. The abdomen is soft and nontender. Nontender over the trochanters. No cellulitis or lymphadenopathy.  Straight leg raise is negative. Motor is 5/5 including EHL, tibialis anterior, plantar flexion, and hamstrings. Patient is normoreflexic. There is no Babinski or clonus. Sensory exam is intact to light touch. Patient has good distal pulses. No DVT. No pain and normal range of motion without instability of the hips, knees and ankles.  Neurological: He is alert and oriented to person, place, and time.  Skin: Skin is warm and dry.  Psychiatric: He  has a normal mood and affect.     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  We had extensive discussion concerning his current pathology, relevant anatomy and treatment options. He reports symptoms since 2005 into the leg more over the past year. He is still taking Norco 10/325. We discussed options at  this point in time for ongoing symptoms, living with his symptoms versus decompression. At this point with ongoing symptoms and neurologic deficit, we will update his MRI, determine the size of the cyst and the neural compression. Certainly if it is a larger or unchanged, then may consider lumbar decompression. If smaller and resolving, then continued observation. We will have him set up for that procedure and see him back following that.  This gentleman is a 55 year old with right lower extremity radicular pain, refractory, secondary to synovial cyst at L4-L5 to the right with compression of the L4 root in the foramen. He has had epidurals and synovial cysts injections without avail. Overall, he continues to be miserable. We repeated his [facility] at [facility]. He has severe stenosis along the course of the descending L4 root related to, most likely a proteinaceous synovial cyst from the right facet. There is just a very small disk protrusion at L4-L5. He has a grade I listhesis at L3-L4, trace listhesis at L4-L5. He has predominant leg pain disposed to back pain. He is a smoker. He has asked about options in terms of diminishing his pain. He is working in [place], unable to get here to [place], unless on the weekends, so we are doing this by telephone conversation. Again, it radiates down into the top of his foot. He does have some quadriceps knee and toe weakness. We talked about getting an appointment back in the office, which he will try to do. We would like to go ahead and proceed with scheduling of any intervention that would be beneficial. We discussed a decompression at L4-L5, possibly L3-L4 excision of synovial cyst. I had an extensive discussion of the risks and benefits of the lumbar decompression with the patient, including bleeding, infection, damage to neurovascular structures, epidural fibrosis, CSF leak requiring repair. We also discussed increase in pain, adjacent segment disease, recurrent disk  herniation, need for future surgery, including repeat decompression and/or fusion. We also discussed risks of postoperative hematoma, paralysis, anesthetic complications including DVT, PE, death, cardiopulmonary dysfunction. In addition, the perioperative and postoperative courses were discussed in detail including the rehabilitative time and return to functional activity and work. I provided the patient with an illustrated handout and utilized the appropriate surgical models. He is a smoker. I advised him against that. It can have deleterious side effects on that. He does have some non-insulin dependant diabetes, a smoker, it would be prudent to have a preoperative clearance prior to that. His medical physician is Dr. Alysia Penna.  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 He has trace listhesis at L3-L4 and then there is not an empty facet sign. His option is decompress the cyst on the right and excise the synovial lining without violating a significant portion of the facet. We did indicate that the cyst can return and that it may require repeat excision and/or fusion. We discussed with any ongoing back pain, the only procedure would be a surgical fusion. He is not interested  in this at this point in time and it would require instrumentation at two levels since he has got a listhesis at L3-L4, any instrumentation of L4-L5 would load the L3-L4 facet, so again it would be more prudent to perform this as minimally invasive without a fusion. Again though, my concern is that he has the facet arthrosis and the ongoing pathology that may cause recurrence of the cyst; he understands. Just to decompress it, we specifically talked about the possibility of CSF leakage with effacement and the neural compression. It may require on the right perhaps a whole hemilaminectomy to fully  evaluate the L4 root out into the foramen. He does have a small disk herniation there that may required discectomy. Hopefully that will not be necessary from the standpoint of his disk's stability. I spent considerable time discussing these. I sent the T2 images to him for consultation, but we will proceed accordingly. We discussed the peri-operative course in detail and the course to return to work. If he has any change in the interim, he is to call.  Plan microlumbar decompression L4-5 right, possible L3-4, removal of synovial cyst  BISSELL, JACLYN M. PA-C for Dr. Beane 11/30/2015, 4:55 PM    

## 2015-12-21 NOTE — Op Note (Signed)
NAME:  Andres Rodriguez, Andres Rodriguez NO.:  [id number]  MEDICAL RECORD NO.:  [id number]  LOCATION:  1604                         FACILITY:  [facility]  PHYSICIAN:  Jene Every, M.D.    DATE OF BIRTH:  03-Mar-1961  DATE OF PROCEDURE:  12/21/2015 DATE OF DISCHARGE:                              OPERATIVE REPORT   PREOPERATIVE DIAGNOSES: 1. Spinal stenosis, L4-5, right. 2. Large synovial cyst from the 4-5 facet, right.  POSTOPERATIVE DIAGNOSES: 1. Spinal stenosis, L4-5, right. 2. Large synovial cyst from the 4-5 facet, right.  PROCEDURE PERFORMED: 1. Microlumbar decompression L4-5, right. 2. Foraminotomies of L4 and L5, right. 3. Excision of synovial cyst, 4-5 right.  ANESTHESIA:  General.  ASSISTANT:  Lanna Poche, PA, technical difficulty increased due to the large synovial cyst, adhesive thecal sac, and compressing the L4 nerve root.  HISTORY:  This is a 55 year old with L4 radiculopathy, myotomal weakness, dermatomal dysesthesias secondary to large synovial cyst exiting cephalad from the 4-5 facet and compressing the 4 root into the 4 foramen.  He had been refractory to conservative treatment, including rest, activity modification, analgesics.  He had quad weakness and decreased reflex indicating L4 nerve root deficit.  He had no back pain. He had a minimal listhesis without instability on flexion, extension on his preoperative x-rays at 4-5 and an MR indicating that synovial cyst. We discussed the decompression on the right, excision of the synovial cyst, foraminotomy.  Risks and benefits including bleeding, infection, damage to neurovascular structures, CSF leakage, epidural fibrosis, adjacent segment disease, need for fusion in the future, and recurrent cyst, etc.  TECHNIQUE:  Patient in supine position, after induction of adequate general anesthesia and 900 clindamycin, he was placed prone on the [place] frame.  All bony prominences were well padded.  Lumbar  region was prepped and draped in usual sterile fashion.  Two 18-gauge spinal needles utilized to localize the L4-5 interspace.  He had a Foley to gravity.  Incision was made from the spinous process above 4 to below 5, subcutaneous tissue was dissected.  Electrocautery was utilized to achieve hemostasis, 0.25% Marcaine with epinephrine was infiltrated in the paraspinous musculature.  McCullough retractor was placed. Operating microscope was draped and brought on the surgical field.  He had a small interlaminar window at 4-5 on the right and a hypertrophic facet.  A Leksell was utilized to remove portion of the lamina of 4 in its inferior aspect.  Utilizing straight curette, detached the ligamentum flavum from the cephalad edge of 5.  We placed a [place] 4 just beneath the ligamentum flavum particularly the dura and then performed a generous foraminotomy of L5.  I then mobilized the 5 root medially.  We decompressed the lateral recess to the medial border of the pedicle.  Performed a partial medial hemi-facetectomy approximately 30% of the facet was excised.  I continued cephalad to detach the ligamentum flavum.  From the caudad edge of 4, we gained access to above the foramen of 4.  I preserved the pars in the lateral recess decompression and after removal of ligamentum flavum, we decompressed the lateral recess to the medial border of the pedicle and identified the foramen of 4.  The disk was not herniated.  Large synovial cyst extending cephalad from the facet and it was displacing the nerve root cephalad.  Neural probe did not pass out the foramen.  I was able to gently develop a plane between the thecal sac the L4 nerve root and the entire cyst protecting it cephalad and caudad into the foramen.  We used an 18-gauge needle to confirm the contents of the synovial cyst.  A slightly discolored gelatinous fluid was expressed.  The cyst had been decompressed.  We removed the contents of  the cyst and sent to Pathology for the appropriate evaluation, both the lining and the interstices of the cyst, there was a portion of that adhesed back to the thecal sac which was then left and copious portions of gelatinous fluid was evacuated.  This tissue was sent to Pathology for the appropriate analysis.  Following this, significantly decompressed the L4 foramen. We were able to identify the 4 root and the thecal sac exiting out the foramen without difficulty at this point in time, and a neural probe passed freely out that foramen.  We also looked above the 4 root and below it.  The probe was passed above and below it.  There was free movement of the 4 root.  No evidence of CSF leakage or active bleeding. I copiously irrigated the space with antibiotic irrigation.  I then draped epidural fat over the thecal sac.  We removed the [facility] retractor, copiously irrigated the wound.  I then closed the dorsolumbar fascia with 1 Vicryl, subcu with 2-0, and skin with subcuticular Prolene.  Sterile dressing was applied.  He was placed prone on the hospital bed, extubated without difficulty, and transported to the recovery room in satisfactory condition.  The patient tolerated the procedure well.  No complications.  ASSISTANT:  Lanna Poche, PA.  ESTIMATED BLOOD LOSS:  Minimal blood loss.  Again intraoperatively, we spent considerable time developing the plane between the adhesed synovial cyst and the thecal sac in the 4 root, we were able to finally do so and isolate the cyst, decompressed, and excised.     Jene Every, M.D.     Cordelia Pen  D:  12/21/2015  T:  12/21/2015  Job:  [id number]

## 2015-12-21 NOTE — Anesthesia Postprocedure Evaluation (Signed)
Anesthesia Post Note  Patient: Andres Rodriguez [facility].  Procedure(s) Performed: Procedure(s) (LRB): MICRO LUMBAR DECOMPRESSION L4-5 ON RIGHT (Right) REMOVAL OF SYNOVIAL CYST (Right)  Patient location during evaluation: PACU Anesthesia Type: General Level of consciousness: awake and alert Pain management: pain level controlled Vital Signs Assessment: post-procedure vital signs reviewed and stable Respiratory status: spontaneous breathing Cardiovascular status: blood pressure returned to baseline Anesthetic complications: no    Last Vitals:  Filed Vitals:   12/21/15 1330 12/21/15 1345  BP: 119/77 136/77  Pulse: 98 75  Temp: 36.8 C   Resp: 16 13    Last Pain:  Filed Vitals:   12/21/15 1408  PainSc: 4                  Kennieth Rad

## 2015-12-21 NOTE — Discharge Instructions (Signed)
Walk As Tolerated utilizing back precautions.  No bending, twisting, or lifting.  No driving for 2 weeks.   °Aquacel dressing may remain in place until follow up. May shower with aquacel dressing in place. If the dressing peels off or becomes saturated, you may remove aquacel dressing and place gauze and tape dressing which should be kept clean and dry and changed daily. Do not remove steri-strips if they are present. °See Dr. Dijuan Sleeth in office in 10 to 14 days. Begin taking aspirin 81mg per day starting 4 days after your surgery if not allergic to aspirin or on another blood thinner. °Walk daily even outside. Use a cane or walker only if necessary. °Avoid sitting on soft sofas. ° °

## 2015-12-21 NOTE — Anesthesia Preprocedure Evaluation (Addendum)
Anesthesia Evaluation  Patient identified by MRN, date of birth, ID band Patient awake    Reviewed: Allergy & Precautions, NPO status , Patient's Chart, lab work & pertinent test results  Airway Mallampati: II  TM Distance: >3 FB Neck ROM: Full    Dental  (+) Edentulous Upper, Dental Advisory Given   Pulmonary sleep apnea and Continuous Positive Airway Pressure Ventilation , COPD, former smoker,    breath sounds clear to auscultation       Cardiovascular hypertension, Pt. on medications  Rhythm:Regular Rate:Normal     Neuro/Psych negative neurological ROS     GI/Hepatic negative GI ROS, Neg liver ROS, (+) Hepatitis -, C  Endo/Other  diabetes, Type 2, Oral Hypoglycemic Agents  Renal/GU negative Renal ROS     Musculoskeletal  (+) Arthritis ,   Abdominal   Peds  Hematology negative hematology ROS (+)   Anesthesia Other Findings   Reproductive/Obstetrics                            Lab Results  Component Value Date   WBC 12.7* 12/20/2015   HGB 15.6 12/20/2015   HCT 45.5 12/20/2015   MCV 91.4 12/20/2015   PLT 267 12/20/2015   Lab Results  Component Value Date   CREATININE 0.60* 12/20/2015   BUN 9 12/20/2015   NA 141 12/20/2015   K 4.2 12/20/2015   CL 104 12/20/2015   CO2 25 12/20/2015    Anesthesia Physical Anesthesia Plan  ASA: II  Anesthesia Plan: General   Post-op Pain Management:    Induction: Intravenous  Airway Management Planned: Oral ETT  Additional Equipment:   Intra-op Plan:   Post-operative Plan: Extubation in OR  Informed Consent: I have reviewed the patients History and Physical, chart, labs and discussed the procedure including the risks, benefits and alternatives for the proposed anesthesia with the patient or authorized representative who has indicated his/her understanding and acceptance.   Dental advisory given  Plan Discussed with:  CRNA  Anesthesia Plan Comments:         Anesthesia Quick Evaluation

## 2015-12-21 NOTE — Interval H&P Note (Signed)
History and Physical Interval Note:  12/21/2015 8:29 AM  Andres Del.  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 After consideration of risks, benefits and other options for treatment, the patient has consented to  Procedure(s): MICRO LUMBAR DECOMPRESSION L4-5 ON RIGHT , POSSIBLE L3-4 (Right) REMOVAL OF SYNOVIAL CYST (Right) as a surgical intervention .  The patient's history has been reviewed, patient examined, no change in status, stable for surgery.  I have reviewed the patient's chart and labs.  Questions were answered to the patient's satisfaction.     Dinorah Masullo C

## 2015-12-21 NOTE — Anesthesia Procedure Notes (Signed)
Procedure Name: Intubation Performed by: Kizzie Fantasia Pre-anesthesia Checklist: Patient identified, Emergency Drugs available, Suction available, Patient being monitored and Timeout performed Patient Re-evaluated:Patient Re-evaluated prior to inductionOxygen Delivery Method: Circle system utilized Preoxygenation: Pre-oxygenation with 100% oxygen Intubation Type: IV induction Ventilation: Oral airway inserted - appropriate to patient size and Mask ventilation without difficulty Laryngoscope Size: Mac and 4 Grade View: Grade I Tube type: Oral Tube size: 7.5 mm Number of attempts: 1 Airway Equipment and Method: Stylet Placement Confirmation: ETT inserted through vocal cords under direct vision,  positive ETCO2,  CO2 detector and breath sounds checked- equal and bilateral Secured at: 23 cm Tube secured with: Tape Dental Injury: Teeth and Oropharynx as per pre-operative assessment

## 2015-12-21 NOTE — Brief Op Note (Signed)
12/21/2015  1:01 PM  PATIENT:  Andres Rodriguez.  55 y.o. 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  PROCEDURE:  Procedure(s): MICRO LUMBAR DECOMPRESSION L4-5 ON RIGHT (Right) REMOVAL OF SYNOVIAL CYST (Right)  SURGEON:  Surgeon(s) and Role:    * Jene Every, MD - Primary  PHYSICIAN ASSISTANT:   ASSISTANTS: Bissell   ANESTHESIA:   general  EBL:  Total I/O In: 1500 [I.V.:1500] Out: 600 [Urine:450; Blood:150]  BLOOD ADMINISTERED:none  DRAINS: none   LOCAL MEDICATIONS USED:  MARCAINE     SPECIMEN:  Source of Specimen:  L45 cyst  DISPOSITION OF SPECIMEN:  PATHOLOGY  COUNTS:  YES  TOURNIQUET:  * No tourniquets in log *  DICTATION: .Other Dictation: Dictation Number  [id number]  PLAN OF CARE: Admit for overnight observation  PATIENT DISPOSITION:  PACU - hemodynamically stable.   Delay start of Pharmacological VTE agent (>24hrs) due to surgical blood loss or risk of bleeding: yes

## 2015-12-21 NOTE — Transfer of Care (Signed)
Immediate Anesthesia Transfer of Care Note  Patient: Andres Rodriguez [facility]  Procedure(s) Performed: Procedure(s): MICRO LUMBAR DECOMPRESSION L4-5 ON RIGHT (Right) REMOVAL OF SYNOVIAL CYST (Right)  Patient Location: PACU  Anesthesia Type:General  Level of Consciousness: awake, alert  and oriented  Airway & Oxygen Therapy: Patient Spontanous Breathing and Patient connected to face mask oxygen  Post-op Assessment: Report given to RN and Post -op Vital signs reviewed and stable  Post vital signs: Reviewed and stable  Last Vitals:  Filed Vitals:   12/21/15 0839  BP: 149/88  Pulse: 83  Temp: 36.6 C  Resp: 18    Complications: No apparent anesthesia complications

## 2015-12-22 ENCOUNTER — Encounter (HOSPITAL_COMMUNITY): Payer: Self-pay | Admitting: Specialist

## 2015-12-22 DIAGNOSIS — E119 Type 2 diabetes mellitus without complications: Secondary | ICD-10-CM | POA: Diagnosis not present

## 2015-12-22 DIAGNOSIS — M7138 Other bursal cyst, other site: Secondary | ICD-10-CM | POA: Diagnosis not present

## 2015-12-22 DIAGNOSIS — Z87891 Personal history of nicotine dependence: Secondary | ICD-10-CM | POA: Diagnosis not present

## 2015-12-22 DIAGNOSIS — M5416 Radiculopathy, lumbar region: Secondary | ICD-10-CM | POA: Diagnosis not present

## 2015-12-22 DIAGNOSIS — G4730 Sleep apnea, unspecified: Secondary | ICD-10-CM | POA: Diagnosis not present

## 2015-12-22 DIAGNOSIS — J449 Chronic obstructive pulmonary disease, unspecified: Secondary | ICD-10-CM | POA: Diagnosis not present

## 2015-12-22 DIAGNOSIS — I10 Essential (primary) hypertension: Secondary | ICD-10-CM | POA: Diagnosis not present

## 2015-12-22 DIAGNOSIS — M479 Spondylosis, unspecified: Secondary | ICD-10-CM | POA: Diagnosis not present

## 2015-12-22 DIAGNOSIS — M4806 Spinal stenosis, lumbar region: Secondary | ICD-10-CM | POA: Diagnosis not present

## 2015-12-22 LAB — GLUCOSE, CAPILLARY: Glucose-Capillary: 137 mg/dL — ABNORMAL HIGH (ref 65–99)

## 2015-12-22 MED ORDER — OXAPROZIN 600 MG PO TABS: 600.0000 mg | ORAL_TABLET | Freq: Two times a day (BID) | ORAL | Status: AC

## 2015-12-22 NOTE — Evaluation (Signed)
Physical Therapy Evaluation-1x  Patient Details Name: Andres Rodriguez. MRN: [id number] DOB: 10-15-1961 Today's Date: 12/22/2015   History of Present Illness  55 yo male s/p decompression L4-L5, excision of cyst 12/20/14.   Clinical Impression  On eval, pt was Ind with mobility-walked ~400 feet. Minimal pain with activity. All education completed. Ok to d/c from PT standpoint.    Follow Up Recommendations No PT follow up    Equipment Recommendations  None recommended by PT    Recommendations for Other Services       Precautions / Restrictions Precautions Precautions: Back Precaution Comments: Reviewed back precautions-pt able to recall 3/3 Restrictions Weight Bearing Restrictions: No      Mobility  Bed Mobility               General bed mobility comments: pt sitting EOB. Pt reports no difficulty with bed mobility.   Transfers Overall transfer level: Independent                  Ambulation/Gait Ambulation/Gait assistance: Independent Ambulation Distance (Feet): 400 Feet Assistive device: None Gait Pattern/deviations: WFL(Within Functional Limits)        Stairs Stairs: Yes Stairs assistance: Modified independent (Device/Increase time) Stair Management: One rail Right;Forwards;Alternating pattern Number of Stairs: 4    Wheelchair Mobility    Modified Rankin (Stroke Patients Only)       Balance                                             Pertinent Vitals/Pain Pain Assessment: 0-10 Pain Score: 3  Pain Location: back Pain Descriptors / Indicators: Sore;Tightness Pain Intervention(s): Monitored during session;Repositioned    Home Living Family/patient expects to be discharged to:: Private residence Living Arrangements: Spouse/significant other Available Help at Discharge: Family (wife and children)           Home Equipment: Information systems manager - built in Additional Comments: lives in [place] during the week:  in  process of family moving there    Prior Function Level of Independence: Independent         Comments: works as a Production designer, 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     Higher education careers adviser        Extremity/Trunk Assessment   Upper Extremity Assessment: Defer to OT evaluation           Lower Extremity Assessment: Overall WFL for tasks assessed      Cervical / Trunk Assessment: Normal  Communication   Communication: No difficulties  Cognition Arousal/Alertness: Awake/alert Behavior During Therapy: WFL for tasks assessed/performed Overall Cognitive Status: Within Functional Limits for tasks assessed                      General Comments      Exercises        Assessment/Plan    PT Assessment Patent does not need any further PT services  PT Diagnosis     PT Problem List    PT Treatment Interventions     PT Goals (Current goals can be found in the Care Plan section) Acute Rehab PT Goals Patient Stated Goal: back to work Wednesday PT Goal Formulation: All assessment and education complete, DC therapy    Frequency     Barriers to discharge        Co-evaluation  End of Session   Activity Tolerance: Patient tolerated treatment well Patient left: in chair;with call bell/phone within reach      Functional Assessment Tool Used: clinical judgement Functional Limitation: Mobility: Walking and moving around Mobility: Walking and Moving Around Current Status [phone]): 0 percent impaired, limited or restricted Mobility: Walking and Moving Around Goal Status [phone]): 0 percent impaired, limited or restricted Mobility: Walking and Moving Around Discharge Status [phone]): 0 percent impaired, limited or restricted    Time: 0911-0923 PT Time Calculation (min) (ACUTE ONLY): 12 min   Charges:   PT Evaluation $PT Eval Low Complexity: 1 Procedure     PT G Codes:   PT G-Codes **NOT FOR INPATIENT CLASS** Functional Assessment Tool Used: clinical  judgement Functional Limitation: Mobility: Walking and moving around Mobility: Walking and Moving Around Current Status (B1478): 0 percent impaired, limited or restricted Mobility: Walking and Moving Around Goal Status (G9562): 0 percent impaired, limited or restricted Mobility: Walking and Moving Around Discharge Status [phone]): 0 percent impaired, limited or restricted    Rebeca Alert, MPT Pager: [phone]

## 2015-12-22 NOTE — Discharge Summary (Signed)
Physician Discharge Summary   Patient ID: Andres Rodriguez [facility]. MRN: [id number] DOB/AGE: 01/04/1961 55 y.o.  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 COPD (chronic obstructive pulmonary disease) ([place])   . Diabetes mellitus without complication ([place])   . Spinal stenosis   . Arthritis   . Sleep apnea     USES C-PAP  . Hepatitis     HX HEP C - IS NOW UNDETECTABLE PER PT   Discharge Diagnoses:   Principal Problem:   Spinal stenosis of lumbar region Active Problems:   Synovial cyst of lumbar facet joint  Procedure:  Procedure(s) (LRB): MICRO LUMBAR DECOMPRESSION L4-5 ON RIGHT (Right) REMOVAL OF SYNOVIAL CYST (Right)   Consults: None  HPI:  see H&P    Laboratory Data: Hospital Outpatient Visit on 12/20/2015  Component Date Value Ref Range Status  . Sodium 12/20/2015 141  135 - 145 mmol/L Final  . Potassium 12/20/2015 4.2  3.5 - 5.1 mmol/L Final  . Chloride 12/20/2015 104  101 - 111 mmol/L Final  . CO2 12/20/2015 25  22 - 32 mmol/L Final  . Glucose, Bld 12/20/2015 107* 65 - 99 mg/dL Final  . BUN 12/20/2015 9  6 - 20 mg/dL Final  . Creatinine, Ser 12/20/2015 0.60* 0.61 - 1.24 mg/dL Final  . Calcium 12/20/2015 9.2  8.9 - 10.3 mg/dL Final  . GFR calc non Af Amer 12/20/2015 >60  >60 mL/min Final  . GFR calc Af Amer 12/20/2015 >60  >60 mL/min Final   Comment: (NOTE) The eGFR has been calculated using the CKD EPI equation. This calculation has not been validated in all clinical situations. eGFR's persistently <60 mL/min signify possible Chronic Kidney Disease.   . Anion gap 12/20/2015 12  5 - 15 Final  . WBC 12/20/2015 12.7* 4.0 - 10.5 K/uL Final  . RBC 12/20/2015 4.98  4.22 - 5.81 MIL/uL Final  . Hemoglobin 12/20/2015 15.6  13.0 - 17.0 g/dL Final  . HCT 12/20/2015 45.5  39.0 - 52.0 % Final  . MCV 12/20/2015 91.4  78.0 - 100.0 fL Final  .  MCH 12/20/2015 31.3  26.0 - 34.0 pg Final  . MCHC 12/20/2015 34.3  30.0 - 36.0 g/dL Final  . RDW 12/20/2015 13.1  11.5 - 15.5 % Final  . Platelets 12/20/2015 267  150 - 400 K/uL Final  . MRSA, PCR 12/20/2015 NEGATIVE  NEGATIVE Final  . Staphylococcus aureus 12/20/2015 NEGATIVE  NEGATIVE Final   Comment:        The Xpert SA Assay (FDA approved for NASAL specimens in patients over 29 years of age), is one component of a comprehensive surveillance program.  Test performance has been validated by [employer] for patients greater than or equal to 50 year old. It is not intended to diagnose infection nor to guide or monitor treatment.   . Hgb A1c MFr Bld 12/20/2015 6.4* 4.8 - 5.6 % Final   Comment: (NOTE)         Pre-diabetes: 5.7 - 6.4         Diabetes: >6.4         Glycemic control for adults with diabetes: <7.0   . Mean Plasma Glucose 12/20/2015 137   Final   Comment: (NOTE) Performed At: [facility] [place], [place] [id number] Lindon Romp MD TO:[id number]     Recent Labs  12/20/15 1530  HGB 15.6    Recent Labs  12/20/15 1530  WBC 12.7*  RBC 4.98  HCT 45.5  PLT 267    Recent Labs  12/20/15 1530  NA 141  K 4.2  CL 104  CO2 25  BUN 9  CREATININE 0.60*  GLUCOSE 107*  CALCIUM 9.2   No results for input(s): LABPT, INR in the last 72 hours.  X-Rays:Dg Lumbar Spine 2-3 Views  12/20/2015  CLINICAL DATA:  Mid lower back pain into RIGHT hip for 9 months, pain worse when lying flat or walking, pain in leg like it's falling asleep, diabetes mellitus, COPD, former smoker EXAM: LUMBAR SPINE - 2-3 VIEW COMPARISON:  MRI lumbar spine 11/17/2015 FINDINGS: 5 non-rib-bearing lumbar vertebra. Vertebral body heights maintained. Slight disc space narrowing L4-L5. Minimal anterolisthesis L4-L5. Vertebral body heights maintained without fracture or additional subluxation. No bone destruction or spondylolysis. SI joints preserved. IMPRESSION: Minimal  degenerative disc disease changes at L4-L5. No acute osseous abnormalities. Suspected synovial cysts within the spinal canal at L4-L5 on prior MR is not radiographically evident. Electronically Signed   By: Lavonia Dana M.D.   On: 12/20/2015 16:26   Dg Spine Portable 1 View  12/21/2015  CLINICAL DATA:  L4-5 lumbar decompression. EXAM: PORTABLE SPINE - 1 VIEW COMPARISON:  Same day. FINDINGS: Single lateral cross-table projection of lumbar spine was obtained. This image demonstrates surgical probe directed at posterior margin of L4-5, with another probe directed toward posterior margin of L5 vertebral body. IMPRESSION: Surgical localization as described above. Electronically Signed   By: Marijo Conception, M.D.   On: 12/21/2015 12:23   Dg Spine Portable 1 View  12/21/2015  CLINICAL DATA:  Localization during lumbar decompression at L4-5. EXAM: PORTABLE SPINE - 1 VIEW COMPARISON:  Film earlier today at 1044 hours FINDINGS: Cross-table lateral film shows advancement of metallic probes and retractors posteriorly at the lower lumbar spine. Probes are positioned at the level of the L4 and L5 spinous process. Retractors project immediately posterior to the L4-5 disc space level. IMPRESSION: Intraoperative imaging demonstrating posterior localization at the L4-5 level. Electronically Signed   By: Aletta Edouard M.D.   On: 12/21/2015 11:36   Dg Spine Portable 1 View  12/21/2015  CLINICAL DATA:  Lumbar decompression of L4-5. EXAM: PORTABLE SPINE - 1 VIEW COMPARISON:  MRI scan of November 17, 2015. FINDINGS: Single intraoperative cross-table lateral projection of lumbar spine demonstrates 1 surgical probe directed at L3-4 posterior spinous process, with another directed at posterior interspinous space of L4-5. IMPRESSION: Surgical localization as described above. Electronically Signed   By: Marijo Conception, M.D.   On: 12/21/2015 11:24    EKG: Orders placed or performed during the hospital encounter of 12/20/15  .  EKG 12 lead  . EKG 12 lead     Hospital Course: Patient was admitted to [facility] and taken to the OR and underwent the above state procedure without complications.  Patient tolerated the procedure well and was later transferred to the recovery room and then to the orthopaedic floor for postoperative care.  They were given PO and IV analgesics for pain control following their surgery.  They were given 24 hours of postoperative antibiotics.   PT was consulted postop to assist with mobility and transfers.  The patient was allowed to be WBAT with therapy and was taught back precautions. Discharge planning was consulted to help with postop disposition and equipment needs.  Patient had a good night on the evening of surgery  and started to get up OOB with therapy on day one. Patient was seen in rounds and was ready to go home on day one.  They were given discharge instructions and dressing directions.  They were instructed on when to follow up in the office with Dr. Tonita Cong.   Diet: Diabetic diet Activity:WBAT; Lspine precautions Follow-up:in 10-14 days Disposition - Home Discharged Condition: good   Discharge Instructions    Call MD / Call 911    Complete by:  As directed   If you experience chest pain or shortness of breath, CALL 911 and be transported to the hospital emergency room.  If you develope a fever above 101 F, pus (white drainage) or increased drainage or redness at the wound, or calf pain, call your surgeon's office.     Constipation Prevention    Complete by:  As directed   Drink plenty of fluids.  Prune juice may be helpful.  You may use a stool softener, such as Colace (over the counter) 100 mg twice a day.  Use MiraLax (over the counter) for constipation as needed.     Diet - low sodium heart healthy    Complete by:  As directed      Increase activity slowly as tolerated    Complete by:  As directed             Medication List    STOP taking these medications         HYDROcodone-acetaminophen 10-325 MG tablet  Commonly known as:  NORCO     metaxalone 800 MG tablet  Commonly known as:  SKELAXIN     omega-3 acid ethyl esters 1 g capsule  Commonly known as:  LOVAZA      TAKE these medications        docusate sodium 100 MG capsule  Commonly known as:  COLACE  Take 1 capsule (100 mg total) by mouth 2 (two) times daily as needed for mild constipation.     losartan 25 MG tablet  Commonly known as:  COZAAR  Take 25 mg by mouth at bedtime.     metFORMIN 1000 MG tablet  Commonly known as:  GLUCOPHAGE  Take 1,000 mg by mouth 2 (two) times daily with a meal.     methocarbamol 500 MG tablet  Commonly known as:  ROBAXIN  Take 1 tablet (500 mg total) by mouth every 8 (eight) hours as needed for muscle spasms.     oxaprozin 600 MG tablet  Commonly known as:  DAYPRO  Take 1 tablet (600 mg total) by mouth 2 (two) times daily. Resume 5 days post-op     oxyCODONE-acetaminophen 5-325 MG tablet  Commonly known as:  PERCOCET  Take 1-2 tablets by mouth every 4 (four) hours as needed.           Follow-up Information    Follow up with BEANE,JEFFREY C, MD In 2 weeks.   Specialty:  Orthopedic Surgery   Why:  For suture removal   Contact information:   [address] [place] [address] [phone]       Signed: Lacie Draft, PA-C Orthopaedic Surgery 12/22/2015, 9:48 AM

## 2015-12-22 NOTE — Evaluation (Signed)
Occupational Therapy Evaluation Patient Details Name: Andres Rodriguez [facility]. MRN: [id number] DOB: 1961-10-13 Today's Date: 12/22/2015    History of Present Illness s/p L4-5 decompression and removal of synovial cyst   Clinical Impression   This 55 year old man was admitted for the above surgery. All education was completed. No further OT is needed at this time    Follow Up Recommendations  No OT follow up    Equipment Recommendations  None recommended by OT    Recommendations for Other Services       Precautions / Restrictions Precautions Precautions: Back Restrictions Weight Bearing Restrictions: No      Mobility Bed Mobility                  Transfers Overall transfer level: Independent                    Balance                                            ADL Overall ADL's : Needs assistance/impaired                                       General ADL Comments: reviewed back precautions in context of ADLs. Pt can usually cross legs, but he has tightness this am.  Needs set up for UB adls and min A for LB bathing/mod for LB dressing.  Performed toilet transfer:  min guard for safety.  Pt demonstrates good body mechanics. He was at EOB when I arrived, but verbalizes understanding of bed mobility.  He has a Insurance underwriter at home     Vision     Perception     Praxis      Pertinent Vitals/Pain Pain Assessment: 0-10 Pain Score: 2  Pain Location: back Pain Descriptors / Indicators: Tightness Pain Intervention(s): Limited activity within patient's tolerance;Monitored during session;Premedicated before session;Repositioned     Hand Dominance     Extremity/Trunk Assessment Upper Extremity Assessment Upper Extremity Assessment: Overall WFL for tasks assessed           Communication Communication Communication: No difficulties   Cognition Arousal/Alertness: Awake/alert Behavior During  Therapy: WFL for tasks assessed/performed Overall Cognitive Status: Within Functional Limits for tasks assessed                     General Comments       Exercises       Shoulder Instructions      Home Living Family/patient expects to be discharged to:: Private residence Living Arrangements: Spouse/significant other Available Help at Discharge: Family (wife and children)               Bathroom Shower/Tub: Walk-in Human resources officer: Handicapped height     Home Equipment: Information systems manager - built in   Additional Comments: lives in [place] during the week:  in process of family moving there      Prior Functioning/Environment Level of Independence: Independent        Comments: works as a Production designer, 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    OT Diagnosis: Generalized weakness   OT Problem List:     OT Treatment/Interventions:      OT Goals(Current goals  can be found in the care plan section) Acute Rehab OT Goals Patient Stated Goal: back to work Wednesday OT Goal Formulation: All assessment and education complete, DC therapy  OT Frequency:     Barriers to D/C:            Co-evaluation              End of Session    Activity Tolerance: Patient tolerated treatment well Patient left: in bed (EOB)   Time: 4098-1191 OT Time Calculation (min): 15 min Charges:  OT General Charges $OT Visit: 1 Procedure OT Evaluation $OT Eval Low Complexity: 1 Procedure G-Codes: OT G-codes **NOT FOR INPATIENT CLASS** Functional Assessment Tool Used: clinical observation and judgment Functional Limitation: Self care Self Care Current Status (Y7829): At least 20 percent but less than 40 percent impaired, limited or restricted Self Care Goal Status (F6213): At least 20 percent but less than 40 percent impaired, limited or restricted Self Care Discharge Status [phone]): At least 20 percent but less than 40 percent impaired, limited or restricted   [facility] 12/22/2015, 8:40 AM Marica Otter, OTR/L [phone] 12/22/2015

## 2015-12-22 NOTE — Progress Notes (Addendum)
Subjective: 1 Day Post-Op Procedure(s) (LRB): MICRO LUMBAR DECOMPRESSION L4-5 ON RIGHT (Right) REMOVAL OF SYNOVIAL CYST (Right) Patient reports pain as mild.  Seen by Dr. Shelle Iron in AM rounds. Pain well controlled. Feels ready for D/C today. No leg pain.  Objective: Vital signs in last 24 hours: Temp:  [97.5 F (36.4 C)-98.8 F (37.1 C)] 98.8 F (37.1 C) (01/19 0600) Pulse Rate:  [70-99] 84 (01/19 0600) Resp:  [12-18] 18 (01/19 0600) BP: (116-155)/(62-97) 118/62 mmHg (01/19 0600) SpO2:  [96 %-100 %] 97 % (01/19 0600)  Intake/Output from previous day: 01/18 0701 - 01/19 0700 In: 3334 [P.O.:684; I.V.:2600; IV Piggyback:50] Out: 3100 [Urine:2950; Blood:150] Intake/Output this shift: Total I/O In: 360 [P.O.:360] Out: 250 [Urine:250]   Recent Labs  12/20/15 1530  HGB 15.6    Recent Labs  12/20/15 1530  WBC 12.7*  RBC 4.98  HCT 45.5  PLT 267    Recent Labs  12/20/15 1530  NA 141  K 4.2  CL 104  CO2 25  BUN 9  CREATININE 0.60*  GLUCOSE 107*  CALCIUM 9.2   No results for input(s): LABPT, INR in the last 72 hours.  Neurologically intact ABD soft Neurovascular intact Sensation intact distally Intact pulses distally Dorsiflexion/Plantar flexion intact Incision: dressing C/D/I and no drainage No cellulitis present Compartment soft no sign of DVT  Assessment/Plan: 1 Day Post-Op Procedure(s) (LRB): MICRO LUMBAR DECOMPRESSION L4-5 ON RIGHT (Right) REMOVAL OF SYNOVIAL CYST (Right) Advance diet Up with therapy D/C IV fluids  Dr. Shelle Iron discussed D/C instructions, Lspine precautions Plan D/C home today  Follow up in office in 2 weeks for suture removal  Andres Rodriguez M. 12/22/2015, 9:45 AM

## 2015-12-22 NOTE — Care Management Note (Signed)
Case Management Note  Patient Details  Name: Keval Nam [facility] MRN: [id number] Date of Birth: 1961/02/16  Subjective/Objective:                  MICRO LUMBAR DECOMPRESSION L4-5 ON RIGHT (Right) REMOVAL OF SYNOVIAL CYST (Right) Action/Plan: Discharge planning Expected Discharge Date:  12/22/15               Expected Discharge Plan:  Home/Self Care  In-House Referral:     Discharge planning Services  CM Consult  Post Acute Care Choice:  NA Choice offered to:     DME Arranged:  N/A DME Agency:  NA  HH Arranged:  NA HH Agency:     Status of Service:  Completed, signed off  Medicare Important Message Given:    Date Medicare IM Given:    Medicare IM give by:    Date Additional Medicare IM Given:    Additional Medicare Important Message give by:     If discussed at Long Length of Stay Meetings, dates discussed:    Additional Comments: CM notes pt has no DME needs and no HH service needs.  Pt ambulating independently in hallway.  No other CM needs were communicated. Yves Dill, RN 12/22/2015, 10:09 AM

## 2016-03-26 IMAGING — DX DG SPINE 1V PORT
1 series · 1 of 1 positions shown · non-contrast
Comparison: Film earlier today at 2433 hours

CLINICAL DATA: Localization during lumbar decompression at L4-5.

EXAM:
PORTABLE SPINE - 1 VIEW

[l-spine x-table]
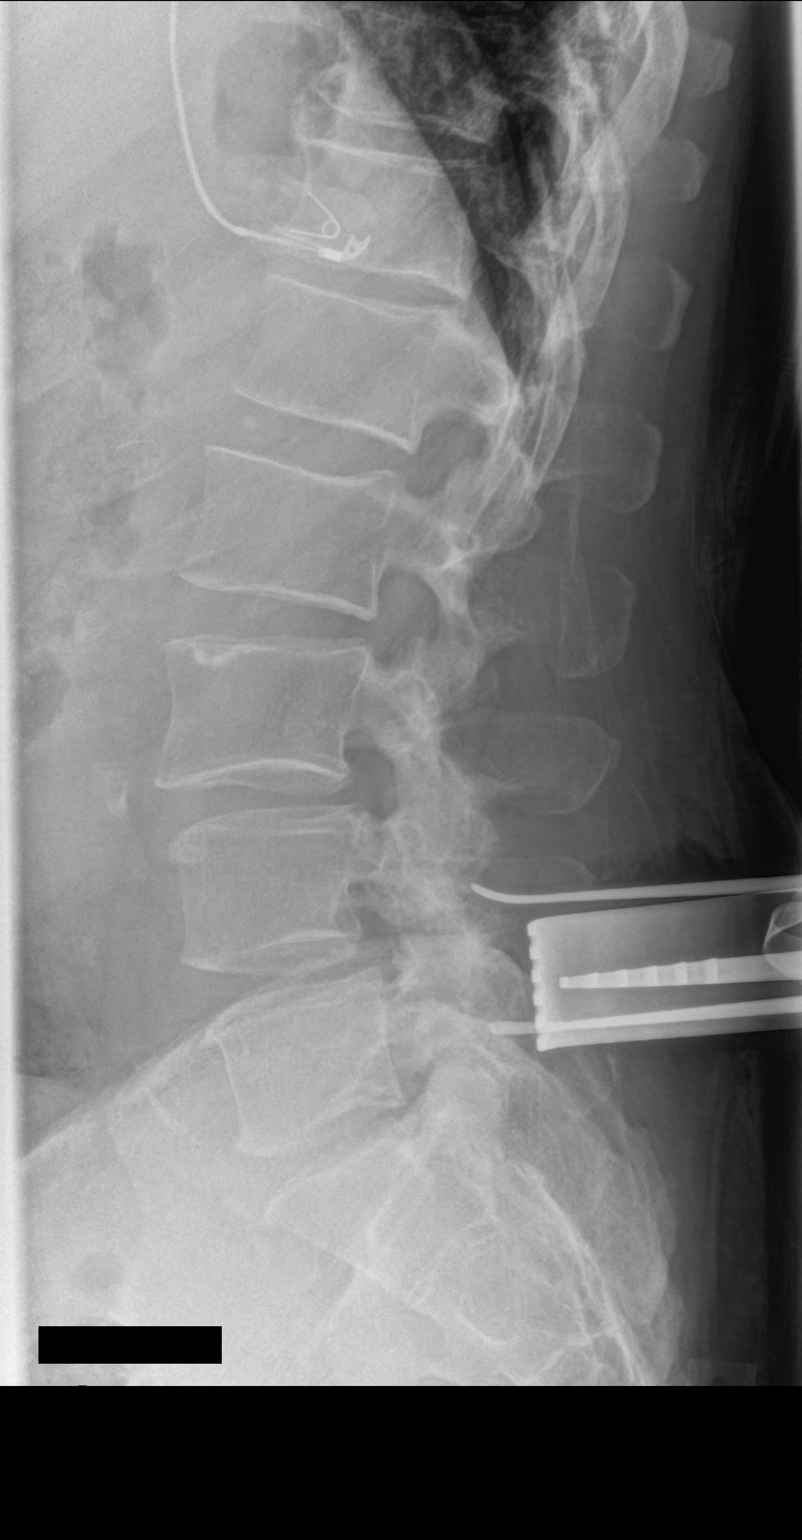

[1 of 1 positions shown; findings below may reference images not displayed]

FINDINGS: Cross-table lateral film shows advancement of metallic probes and
retractors posteriorly at the lower lumbar spine. Probes are
positioned at the level of the L4 and L5 spinous process. Retractors
project immediately posterior to the L4-5 disc space level.
IMPRESSION: Intraoperative imaging demonstrating posterior localization at the
L4-5 level.

## 2016-03-26 IMAGING — DX DG SPINE 1V PORT
1 series · 1 of 1 positions shown · non-contrast
Comparison: Same day.

CLINICAL DATA: L4-5 lumbar decompression.

EXAM:
PORTABLE SPINE - 1 VIEW

[l-spine x-table]
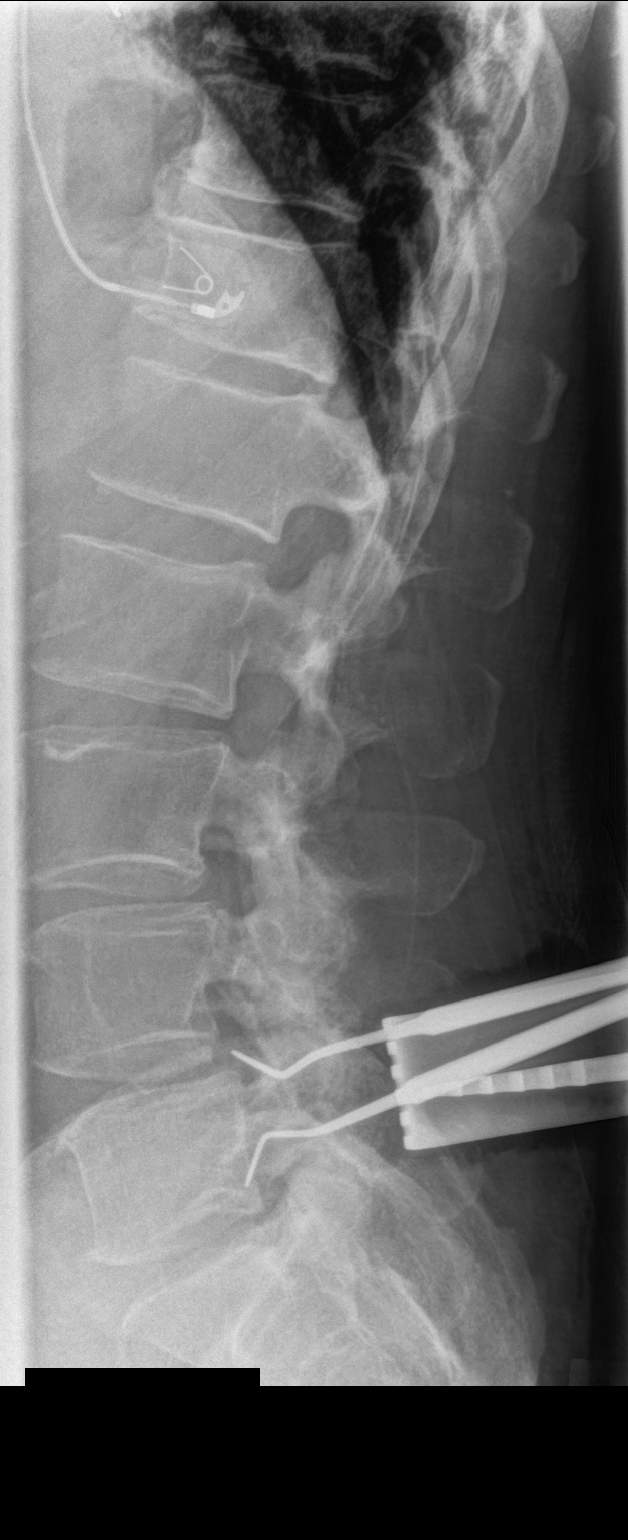

[1 of 1 positions shown; findings below may reference images not displayed]

FINDINGS: Single lateral cross-table projection of lumbar spine was obtained.
This image demonstrates surgical probe directed at posterior margin
of L4-5, with another probe directed toward posterior margin of L5
vertebral body.
IMPRESSION: Surgical localization as described above.
